# Patient Record
Sex: Male | Born: 1965 | Race: Black or African American | Hispanic: No | Smoking: Current every day smoker
Health system: Southern US, Community
[De-identification: ages and names within clinical notes are randomized; demographics above are authoritative.]

## PROBLEM LIST (undated history)

## (undated) DIAGNOSIS — I10 Essential (primary) hypertension: Secondary | ICD-10-CM

## (undated) DIAGNOSIS — G4730 Sleep apnea, unspecified: Secondary | ICD-10-CM

## (undated) DIAGNOSIS — F329 Major depressive disorder, single episode, unspecified: Secondary | ICD-10-CM

## (undated) DIAGNOSIS — F419 Anxiety disorder, unspecified: Secondary | ICD-10-CM

## (undated) DIAGNOSIS — F32A Depression, unspecified: Secondary | ICD-10-CM

## (undated) HISTORY — DX: Depression, unspecified: F32.A

## (undated) HISTORY — DX: Essential (primary) hypertension: I10

## (undated) HISTORY — DX: Major depressive disorder, single episode, unspecified: F32.9

## (undated) HISTORY — PX: FINGER SURGERY: SHX640

## (undated) HISTORY — DX: Sleep apnea, unspecified: G47.30

## (undated) HISTORY — DX: Anxiety disorder, unspecified: F41.9

---

## 1989-05-14 HISTORY — PX: VASECTOMY: SHX75

## 2008-05-14 HISTORY — PX: INCISION AND DRAINAGE ABSCESS / HEMATOMA OF BURSA / KNEE / THIGH: SUR668

## 2014-11-18 ENCOUNTER — Encounter: Payer: Self-pay | Admitting: Urology

## 2014-11-18 ENCOUNTER — Ambulatory Visit (INDEPENDENT_AMBULATORY_CARE_PROVIDER_SITE_OTHER): Payer: Self-pay | Admitting: Urology

## 2014-11-18 VITALS — BP 153/106 | HR 66 | Ht 66.0 in | Wt 185.3 lb

## 2014-11-18 DIAGNOSIS — N529 Male erectile dysfunction, unspecified: Secondary | ICD-10-CM

## 2014-11-18 DIAGNOSIS — N471 Phimosis: Secondary | ICD-10-CM

## 2014-11-18 MED ORDER — SILDENAFIL CITRATE 50 MG PO TABS: 50.0000 mg | ORAL_TABLET | Freq: Every day | ORAL | Status: DC | PRN

## 2014-11-18 NOTE — Progress Notes (Signed)
11/18/2014 11:17 AM   Joshua Banks [birth year] [id number]  Referring provider: No referring provider defined for this encounter.  Chief Complaint  Patient presents with  . Phimosis    New patient- Circumcision Consult    HPI: 49 year old man who presents today for evaluation of possible circumcision. He reports that over the past 10 years, he's had issues at times retracting his foreskin and infections which are quite bothersome.  He was never circumcised as a child but regrets this. He is very interested in proceeding with circumcision for both the purpose of cosmesis as well as to help prevent recurrent episodes of phimosis.  He any difficulty urinating or any urinary symptoms.  He does report a history of mild erectile dysfunction with difficulty maintaining his erections over the past few years.  He tried Viagra from friend about 10 years ago which he tolerated but didn't  Really need the medication at the time.  No problems with ejaculation or orgasm.   No contraindications for PDE 5 inhibitors.    PMH: Past Medical History  Diagnosis Date  . Anxiety   . Depression   . Hypertension   . Sleep apnea     Surgical History: Past Surgical History  Procedure Laterality Date  . Vasectomy  1991  . Finger surgery  1990s  . Incision and drainage abscess / hematoma of bursa / knee / thigh  2010    Home Medications:    Medication List       This list is accurate as of: 11/18/14 11:59 PM.  Always use your most recent med list.               sildenafil 50 MG tablet  Commonly known as:  VIAGRA  Take 1 tablet (50 mg total) by mouth daily as needed for erectile dysfunction.        Allergies:  Allergies  Allergen Reactions  . Bactrim [Sulfamethoxazole-Trimethoprim]     Yeast infection    Family History: Family History  Problem Relation Age of Onset  . Stroke Father   . Heart disease Sister     Social History:  reports that he has been smoking Cigarettes.  He  has a 17.5 pack-year smoking history. He does not have any smokeless tobacco history on file. He reports that he drinks alcohol. He reports that he does not use illicit drugs.  ROS: Urological Symptom Review  Patient is experiencing the following symptoms: Painful intercourse Weak stream Erection problems (male only) Penile pain (male only)    Review of Systems  Gastrointestinal (upper)  : Negative for upper GI symptoms  Gastrointestinal (lower) : Negative for lower GI symptoms  Constitutional : Night Sweats Fatigue  Skin: Negative for skin symptoms  Eyes: Negative for eye symptoms  Ear/Nose/Throat : Negative for Ear/Nose/Throat symptoms  Hematologic/Lymphatic: Negative for Hematologic/Lymphatic symptoms  Cardiovascular : Negative for cardiovascular symptoms  Respiratory : Negative for respiratory symptoms  Endocrine: Negative for endocrine symptoms  Musculoskeletal: Back pain Joint pain  Neurological: Headaches  Psychologic: Depression  Physical Exam: BP 153/106 mmHg  Pulse 66  Ht 5\' 6"  (1.676 m)  Wt 185 lb 4.8 oz (84.052 kg)  BMI 29.92 kg/m2  Constitutional:  Alert and oriented, No acute distress. HEENT: [place] AT, moist mucus membranes.  Trachea midline, no masses. Cardiovascular: No clubbing, cyanosis, or edema. Respiratory: Normal respiratory effort, no increased work of breathing. GI: Abdomen is soft, nontender, nondistended, no abdominal masses GU: No CVA tenderness. Uncircumcised phallus with redundant foreskin which  does retract easily.  Normal scrotum with bilateral descended testicles.   Skin: No rashes, bruises or suspicious lesions. Lymph: No cervical or inguinal adenopathy. Neurologic: Grossly intact, no focal deficits, moving all 4 extremities. Psychiatric: Normal mood and affect.   Assessment & Plan:   1. Phimosis Recurrent episodes of phimosis desiring circumcision.  We discussed risks and benefits of the procedure including  risk of bleeding, infection, damage to surrounding structures, penile sensitivity, and dissatisfaction with cosmesis.  We reviewed that the procedure could be done either in the office under local anesthesia or in the operating room under general anesthesia. He would proceed with circumcision in the office.  2. Erectile dysfunction, unspecified erectile dysfunction type Difficulty maintaining erection.  Trial of PDEi discussed. - sildenafil (VIAGRA) 50 MG tablet; Take 1 tablet (50 mg total) by mouth daily as needed for erectile dysfunction.  Dispense: 6 tablet; Refill: 0   Return in about 4 weeks (around 12/16/2014) for circumcision.  Vanna Scotland, MD  [facility] [address], [address] [place], [place] [address] [phone]

## 2014-11-18 NOTE — Patient Instructions (Signed)
   Circumcision Information and Post Care Instructions  Preparation: Pubic Hair There is no need to completely shave your pubic hair but it is desirable to trim it fairly short. Trim your pubic hair a few days in advance of the operation to allow time for the cut ends to soften again. Hygiene On the morning of the circumcision ensure that you take a good bath or shower and pay particular attention to your genitals. Retract your foreskin as far as you can and clean well under it. Immediately before the time of the procedure empty your bowels and bladder.   After-care: After the procedure your whole penis will be swollen and look very bruised. This is a normal effect of both the injected anaesthetic and the handling it necessarily receives during the operation. These will gradually reduce over the next week or two. Underwear If you normally wear boxers you may find that they give insufficient support immediately post procedure. You may wish to consider some form of briefs which will hold your penis in position to provide support and reduce the friction The Bandage The bandage will normally be wound tightly around the penis. Leave for 48hrs and keep clean and dry.    Promoting Healing Do not apply any antiseptic cream to your penis, nor add any antiseptic to bath water. Though they do help to kill germs, most are corrosive to new skin and actually slow down healing. In the rare cases where an infection develops, see a doctor as soon as possible. Smoking can delay healing and place you at higher risk of infection. You should quit or significantly reduce the amount of cigarettes you smoke prior to and after procedure.  Pain Killers Everyone reacts differently in respect of pain. For most people circumcision will not be truly painful, but a degree of discomfort is to be expected during the first few days. If you choose to take pain killing tablets like Tylenol then follow the instructions precisely.  Do not take more than the recommended maximum dose. Do not take Aspirin or any Aspirin based product since these thin the blood and have an anti-clotting action which can increase bleeding from a wound.  The Stitches Stitches need to remain in place long enough for the cut edges to knit together but not so long as to allow the skin around them to fully heal. In practice this usually means they should remain for between 1 and 2 weeks. Although the doctor will normally use soluble (or self-dissolving) stitches and will dissolve/ fall out on their own.    Time off School or Work There is no absolute need to take time off school or work after circumcision, but you may find it very hard to concentrate on work for the first few days and so may find it useful to take a week off. A week (or even two) off work is very desirable if you do heavy lifting or if your job keeps you seated and unable to move around freely for long periods. You should naturally avoid energetic or contact sports, sexual activity, cycling and swimming until your circumcision has fully healed.      

## 2014-12-24 ENCOUNTER — Ambulatory Visit (INDEPENDENT_AMBULATORY_CARE_PROVIDER_SITE_OTHER): Payer: Managed Care, Other (non HMO) | Admitting: Urology

## 2014-12-24 ENCOUNTER — Encounter: Payer: Self-pay | Admitting: Urology

## 2014-12-24 VITALS — BP 193/101 | HR 48 | Ht 66.0 in | Wt 187.8 lb

## 2014-12-24 DIAGNOSIS — N471 Phimosis: Secondary | ICD-10-CM

## 2014-12-24 DIAGNOSIS — N529 Male erectile dysfunction, unspecified: Secondary | ICD-10-CM | POA: Diagnosis not present

## 2014-12-24 NOTE — Patient Instructions (Signed)
   Circumcision Information and Post Care Instructions  Preparation: Pubic Hair There is no need to completely shave your pubic hair but it is desirable to trim it fairly short. Trim your pubic hair a few days in advance of the operation to allow time for the cut ends to soften again. Hygiene On the morning of the circumcision ensure that you take a good bath or shower and pay particular attention to your genitals. Retract your foreskin as far as you can and clean well under it. Immediately before the time of the procedure empty your bowels and bladder.   After-care: After the procedure your whole penis will be swollen and look very bruised. This is a normal effect of both the injected anaesthetic and the handling it necessarily receives during the operation. These will gradually reduce over the next week or two. Underwear If you normally wear boxers you may find that they give insufficient support immediately post procedure. You may wish to consider some form of briefs which will hold your penis in position to provide support and reduce the friction The Bandage The bandage will normally be wound tightly around the penis. Leave for 48hrs and keep clean and dry.    Promoting Healing Do not apply any antiseptic cream to your penis, nor add any antiseptic to bath water. Though they do help to kill germs, most are corrosive to new skin and actually slow down healing. In the rare cases where an infection develops, see a doctor as soon as possible. Smoking can delay healing and place you at higher risk of infection. You should quit or significantly reduce the amount of cigarettes you smoke prior to and after procedure.  Pain Killers Everyone reacts differently in respect of pain. For most people circumcision will not be truly painful, but a degree of discomfort is to be expected during the first few days. If you choose to take pain killing tablets like Tylenol then follow the instructions precisely.  Do not take more than the recommended maximum dose. Do not take Aspirin or any Aspirin based product since these thin the blood and have an anti-clotting action which can increase bleeding from a wound.  The Stitches Stitches need to remain in place long enough for the cut edges to knit together but not so long as to allow the skin around them to fully heal. In practice this usually means they should remain for between 1 and 2 weeks. Although the doctor will normally use soluble (or self-dissolving) stitches and will dissolve/ fall out on their own.    Time off School or Work There is no absolute need to take time off school or work after circumcision, but you may find it very hard to concentrate on work for the first few days and so may find it useful to take a week off. A week (or even two) off work is very desirable if you do heavy lifting or if your job keeps you seated and unable to move around freely for long periods. You should naturally avoid energetic or contact sports, sexual activity, cycling and swimming until your circumcision has fully healed.      

## 2014-12-24 NOTE — Progress Notes (Signed)
1:59 PM  12/24/14  Joshua Banks 09-Nov-1965 [id number]  Referring provider: No referring provider defined for this encounter.  Chief Complaint  Patient presents with  . Circumcision    HPI: 49 year old man who presents today for circumcision.  He has a history of redundant foreskin and a history of recurrent phimosis.    Risks and benefits previously discussed at length with the patient.  All of his additional questions were answered prior to the procedure today.   PMH: Past Medical History  Diagnosis Date  . Anxiety   . Depression   . Hypertension   . Sleep apnea     Surgical History: Past Surgical History  Procedure Laterality Date  . Vasectomy  1991  . Finger surgery  1990s  . Incision and drainage abscess / hematoma of bursa / knee / thigh  2010    Home Medications:    Medication List       This list is accurate as of: 12/24/14  1:59 PM.  Always use your most recent med list.               sildenafil 50 MG tablet  Commonly known as:  VIAGRA  Take 1 tablet (50 mg total) by mouth daily as needed for erectile dysfunction.        Allergies:  Allergies  Allergen Reactions  . Bactrim [Sulfamethoxazole-Trimethoprim]     Yeast infection    Family History: Family History  Problem Relation Age of Onset  . Stroke Father   . Heart disease Sister   . Kidney disease Neg Hx   . Prostate cancer Neg Hx   . Breast cancer Sister     Social History:  reports that he has been smoking Cigarettes.  He has a 17.5 pack-year smoking history. He does not have any smokeless tobacco history on file. He reports that he drinks alcohol. He reports that he does not use illicit drugs.   Physical Exam: BP 193/101 mmHg  Pulse 48  Ht 5\' 6"  (1.676 m)  Wt 187 lb 12.8 oz (85.186 kg)  BMI 30.33 kg/m2  Constitutional:  Alert and oriented, No acute distress. HEENT: [place] AT, moist mucus membranes.  Trachea midline, no masses. Cardiovascular: No clubbing, cyanosis, or  edema. Respiratory: Normal respiratory effort, no increased work of breathing. GI: Abdomen is soft, nontender, nondistended, no abdominal masses GU: No CVA tenderness. Uncircumcised phallus with redundant foreskin which does retract easily.  Normal scrotum with bilateral descended testicles.   Skin: No rashes, bruises or suspicious lesions. Neurologic: Grossly intact, no focal deficits, moving all 4 extremities. Psychiatric: Normal mood and affect.  Circumcision Procedure   Informed consistent obtained.  Risks discussed.  He was then prepped and draped in a sterile fashion.    Pre-Procedure: -A dorsal penile and ring block were administered using 35 cc 1% lidocaine.  The surgical site was then tested and adequate anesthesia was achieved.    Procedure: - Forceps were used to stretch the phimotic foreskin and expose the coronal area exposing the glans and meatus. -Two circumferential incision were made on the penile skin, one in the mid shaft and another approximately 1 cm proximal to the coronal margin using a blade. -The foreskin was then removed in a sleeve-like fashion with care taken to avoid injury to the urethra.  - Bleeding points were cauterized and adequate hemostasis was achieved - Skin margins were reapproximated with interrupted 3-0 chromic catgut sutures. -A dressing of Xeroform and gauze was applied.  Post-Procedure: -RTC in 4 weeks for wound check   Assessment & Plan:   1. Phimosis S/p uncomplicated circ today Post procedure instructions given Plan for wound check in 4 weeks  2. Erectile dysfunction, unspecified erectile dysfunction type Difficulty maintaining erection.  Trial of Viagram  Return in about 4 weeks (around 01/21/2015) for wound check.  Vanna Scotland, MD  [facility] [address] [place], [place] [address] [phone]

## 2015-05-27 ENCOUNTER — Encounter: Payer: Self-pay | Admitting: Obstetrics and Gynecology

## 2015-05-27 ENCOUNTER — Ambulatory Visit (INDEPENDENT_AMBULATORY_CARE_PROVIDER_SITE_OTHER): Payer: Managed Care, Other (non HMO) | Admitting: Obstetrics and Gynecology

## 2015-05-27 VITALS — BP 182/96 | HR 61 | Resp 16 | Ht 66.0 in | Wt 196.5 lb

## 2015-05-27 DIAGNOSIS — R310 Gross hematuria: Secondary | ICD-10-CM

## 2015-05-27 DIAGNOSIS — R109 Unspecified abdominal pain: Secondary | ICD-10-CM

## 2015-05-27 LAB — MICROSCOPIC EXAMINATION
Bacteria, UA: NONE SEEN
EPITHELIAL CELLS (NON RENAL): NONE SEEN /HPF (ref 0–10)
RBC, UA: >30 /hpf — ABNORMAL HIGH (ref 0–?)
WBC, UA: NONE SEEN /hpf (ref 0–?)

## 2015-05-27 LAB — URINALYSIS, COMPLETE
BILIRUBIN UA: NEGATIVE
Glucose, UA: NEGATIVE
Ketones, UA: NEGATIVE
Leukocytes, UA: NEGATIVE
Nitrite, UA: NEGATIVE
Protein, UA: NEGATIVE
Specific Gravity, UA: 1.02 (ref 1.005–1.030)
UUROB: 1 mg/dL (ref 0.2–1.0)
pH, UA: 7 (ref 5.0–7.5)

## 2015-05-27 MED ORDER — SILODOSIN 8 MG PO CAPS: 8.0000 mg | ORAL_CAPSULE | Freq: Every day | ORAL | Status: AC

## 2015-05-27 NOTE — Progress Notes (Signed)
05/27/2015 10:15 AM   Joshua Banks [birth year] [id number]  Referring provider: No referring provider defined for this encounter.  Chief Complaint  Patient presents with  . Hematuria    HPI: Patient is a 50 year old African-American male who presents today with complaints of gross hematuria beginning this morning. He reports that he has experienced some right-sided flank pain over the last week. He denies fevers, nausea or vomiting. No other urinary symptoms.  No history of stones.  Remote history of epididimytis.  Current smoker 1/2ppd x 40years.  No new sexual partners.  Last STI screening 1 year ago.    He does not get routine prostate cancer screening.  Drinks 3-4 bottles of water per day.   PMH: Past Medical History  Diagnosis Date  . Anxiety   . Depression   . Hypertension   . Sleep apnea     Surgical History: Past Surgical History  Procedure Laterality Date  . Vasectomy  1991  . Finger surgery  1990s  . Incision and drainage abscess / hematoma of bursa / knee / thigh  2010    Home Medications:    Medication List       This list is accurate as of: 05/27/15 10:15 AM.  Always use your most recent med list.               sildenafil 50 MG tablet  Commonly known as:  VIAGRA  Take 1 tablet (50 mg total) by mouth daily as needed for erectile dysfunction.     silodosin 8 MG Caps capsule  Commonly known as:  RAPAFLO  Take 1 capsule (8 mg total) by mouth daily with breakfast.        Allergies:  Allergies  Allergen Reactions  . Bactrim [Sulfamethoxazole-Trimethoprim]     Yeast infection    Family History: Family History  Problem Relation Age of Onset  . Stroke Father   . Heart disease Sister   . Kidney disease Neg Hx   . Prostate cancer Neg Hx   . Breast cancer Sister     Social History:  reports that he has been smoking Cigarettes.  He has a 17.5 pack-year smoking history. He does not have any smokeless tobacco history on file. He reports  that he drinks alcohol. He reports that he does not use illicit drugs.  ROS: UROLOGY Frequent Urination?: Yes Hard to postpone urination?: Yes Burning/pain with urination?: Yes Get up at night to urinate?: No Leakage of urine?: No Urine stream starts and stops?: No Trouble starting stream?: No Do you have to strain to urinate?: No Blood in urine?: Yes Urinary tract infection?: No Sexually transmitted disease?: No Injury to kidneys or bladder?: No Painful intercourse?: No Weak stream?: Yes Erection problems?: Yes Penile pain?: Yes  Gastrointestinal Nausea?: No Vomiting?: No Indigestion/heartburn?: No Diarrhea?: No Constipation?: No  Constitutional Fever: No Night sweats?: Yes Weight loss?: No Fatigue?: No  Skin Skin rash/lesions?: No Itching?: No  Eyes Blurred vision?: No Double vision?: No  Ears/Nose/Throat Sore throat?: No Sinus problems?: No  Hematologic/Lymphatic Swollen glands?: No Easy bruising?: No  Cardiovascular Leg swelling?: No Chest pain?: Yes  Respiratory Cough?: No Shortness of breath?: Yes  Endocrine Excessive thirst?: No  Musculoskeletal Back pain?: Yes Joint pain?: No  Neurological Headaches?: Yes Dizziness?: No  Psychologic Depression?: No Anxiety?: No  Physical Exam: BP 182/96 mmHg  Pulse 61  Resp 16  Ht 5\' 6"  (1.676 m)  Wt 196 lb 8 oz (89.132 kg)  BMI 31.73 kg/m2  Constitutional:  Alert and oriented, No acute distress. HEENT: [place] AT, moist mucus membranes.  Trachea midline, no masses. Cardiovascular: No clubbing, cyanosis, or edema. Respiratory: Normal respiratory effort, no increased work of breathing. GI: Abdomen is soft, nontender, nondistended, no abdominal masses GU: mild right CVA tenderness. palpable clamp from previous vasectomy on left side, testicles descended bilaterally, nontender, normal circumcised phallus DRE: smooth, nontender, slightly enlarged Skin: No rashes, bruises or suspicious  lesions. Lymph: No cervical or inguinal adenopathy. Neurologic: Grossly intact, no focal deficits, moving all 4 extremities. Psychiatric: Normal mood and affect.  Laboratory Data:   Urinalysis  Pertinent Imaging:   Assessment & Plan:    1. Gross hematuria- We discussed the differential diagnosis for hematuria including nephrolithiasis, renal or upper tract tumors, bladder stones, UTIs, or bladder tumors as well as undetermined etiologies. Per AUA guidelines, I did recommend complete hematuria evaluation including CTU, possible urine cytology, and office cystoscopy. - Urinalysis, Complete  2. Flank pain-  Possible kidney stones. Samples of Rapaflo and urine strainer provided.  Encourage patient to push fluids.  3. Prostate cancer screening- DRE slightly enlarged.  Will obtain PSA once hematuria work up complete.   Return for CT Urogram results and cystoscopy.  These notes generated with voice recognition software. I apologize for typographical errors.  Earlie Lou, FNP  [facility] [address] [place], [place] [address] [phone]

## 2015-05-27 NOTE — Patient Instructions (Signed)
Cystoscopy Cystoscopy is a procedure that is used to help your caregiver diagnose and sometimes treat conditions that affect your lower urinary tract. Your lower urinary tract includes your bladder and the tube through which urine passes from your bladder out of your body (urethra). Cystoscopy is performed with a thin, tube-shaped instrument (cystoscope). The cystoscope has lenses and a light at the end so that your caregiver can see inside your bladder. The cystoscope is inserted at the entrance of your urethra. Your caregiver guides it through your urethra and into your bladder. There are two main types of cystoscopy:  Flexible cystoscopy (with a flexible cystoscope).  Rigid cystoscopy (with a rigid cystoscope). Cystoscopy may be recommended for many conditions, including:  Urinary tract infections.  Blood in your urine (hematuria).  Loss of bladder control (urinary incontinence) or overactive bladder.  Unusual cells found in a urine sample.  Urinary blockage.  Painful urination. Cystoscopy may also be done to remove a sample of your tissue to be checked under a microscope (biopsy). It may also be done to remove or destroy bladder stones. LET YOUR CAREGIVER KNOW ABOUT:  Allergies to food or medicine.  Medicines taken, including vitamins, herbs, eyedrops, over-the-counter medicines, and creams.  Use of steroids (by mouth or creams).  Previous problems with anesthetics or numbing medicines.  History of bleeding problems or blood clots.  Previous surgery.  Other health problems, including diabetes and kidney problems.  Possibility of pregnancy, if this applies. PROCEDURE The area around the opening to your urethra will be cleaned. A medicine to numb your urethra (local anesthetic) is used. If a tissue sample or stone is removed during the procedure, you may be given a medicine to make you sleep (general anesthetic). Your caregiver will gently insert the tip of the cystoscope  into your urethra. The cystoscope will be slowly glided through your urethra and into your bladder. Sterile fluid will flow through the cystoscope and into your bladder. The fluid will expand and stretch your bladder. This gives your caregiver a better view of your bladder walls. The procedure lasts about 15-20 minutes. AFTER THE PROCEDURE If a local anesthetic is used, you will be allowed to go home as soon as you are ready. If a general anesthetic is used, you will be taken to a recovery area until you are stable. You may have temporary bleeding and burning on urination.   This information is not intended to replace advice given to you by your health care provider. Make sure you discuss any questions you have with your health care provider.   Document Released: 04/27/2000 Document Revised: 05/21/2014 Document Reviewed: 10/22/2011 Elsevier Interactive Patient Education 2016 [employer] Hematuria, Adult Hematuria is blood in your urine. It can be caused by a bladder infection, kidney infection, prostate infection, kidney stone, or cancer of your urinary tract. Infections can usually be treated with medicine, and a kidney stone usually will pass through your urine. If neither of these is the cause of your hematuria, further workup to find out the reason may be needed. It is very important that you tell your health care provider about any blood you see in your urine, even if the blood stops without treatment or happens without causing pain. Blood in your urine that happens and then stops and then happens again can be a symptom of a very serious condition. Also, pain is not a symptom in the initial stages of many urinary cancers. HOME CARE INSTRUCTIONS   Drink lots of fluid, 3-4   quarts a day. If you have been diagnosed with an infection, cranberry juice is especially recommended, in addition to large amounts of water.  Avoid caffeine, tea, and carbonated beverages because they tend to irritate the  bladder.  Avoid alcohol because it may irritate the prostate.  Take all medicines as directed by your health care provider.  If you were prescribed an antibiotic medicine, finish it all even if you start to feel better.  If you have been diagnosed with a kidney stone, follow your health care provider's instructions regarding straining your urine to catch the stone.  Empty your bladder often. Avoid holding urine for long periods of time.  After a bowel movement, women should cleanse front to back. Use each tissue only once.  Empty your bladder before and after sexual intercourse if you are a male. SEEK MEDICAL CARE IF:  You develop back pain.  You have a fever.  You have a feeling of sickness in your stomach (nausea) or vomiting.  Your symptoms are not better in 3 days. Return sooner if you are getting worse. SEEK IMMEDIATE MEDICAL CARE IF:   You develop severe vomiting and are unable to keep the medicine down.  You develop severe back or abdominal pain despite taking your medicines.  You begin passing a large amount of blood or clots in your urine.  You feel extremely weak or faint, or you pass out. MAKE SURE YOU:   Understand these instructions.  Will watch your condition.  Will get help right away if you are not doing well or get worse.   This information is not intended to replace advice given to you by your health care provider. Make sure you discuss any questions you have with your health care provider.   Document Released: 04/30/2005 Document Revised: 05/21/2014 Document Reviewed: 12/29/2012 Elsevier Interactive Patient Education 2016 Elsevier Inc.  

## 2015-06-06 ENCOUNTER — Ambulatory Visit
Admission: RE | Admit: 2015-06-06 | Discharge: 2015-06-06 | Disposition: A | Discharge status: Home / Self Care | Payer: Managed Care, Other (non HMO) | Source: Ambulatory Visit | Attending: Obstetrics and Gynecology | Admitting: Obstetrics and Gynecology

## 2015-06-06 DIAGNOSIS — R310 Gross hematuria: Secondary | ICD-10-CM | POA: Insufficient documentation

## 2015-06-06 DIAGNOSIS — I700 Atherosclerosis of aorta: Secondary | ICD-10-CM | POA: Insufficient documentation

## 2015-06-06 DIAGNOSIS — N401 Enlarged prostate with lower urinary tract symptoms: Secondary | ICD-10-CM | POA: Diagnosis not present

## 2015-06-06 MED ORDER — IOHEXOL 350 MG/ML SOLN
125.0000 mL | Freq: Once | INTRAVENOUS | Status: AC | PRN
  Administered 2015-06-06: 125 mL via INTRAVENOUS

## 2015-06-13 ENCOUNTER — Encounter: Payer: Self-pay | Admitting: Urology

## 2015-06-13 ENCOUNTER — Ambulatory Visit (INDEPENDENT_AMBULATORY_CARE_PROVIDER_SITE_OTHER): Payer: Managed Care, Other (non HMO) | Admitting: Urology

## 2015-06-13 VITALS — BP 172/114 | HR 70 | Ht 66.0 in | Wt 196.2 lb

## 2015-06-13 DIAGNOSIS — R310 Gross hematuria: Secondary | ICD-10-CM | POA: Diagnosis not present

## 2015-06-13 LAB — URINALYSIS, COMPLETE
Bilirubin, UA: NEGATIVE
Glucose, UA: NEGATIVE
Ketones, UA: NEGATIVE
Leukocytes, UA: NEGATIVE
NITRITE UA: NEGATIVE
Protein, UA: NEGATIVE
Specific Gravity, UA: 1.025 (ref 1.005–1.030)
Urobilinogen, Ur: 0.2 mg/dL (ref 0.2–1.0)
pH, UA: 6 (ref 5.0–7.5)

## 2015-06-13 LAB — MICROSCOPIC EXAMINATION
Bacteria, UA: NONE SEEN
Epithelial Cells (non renal): NONE SEEN /hpf (ref 0–10)

## 2015-06-13 MED ORDER — LIDOCAINE HCL 2 % EX GEL
1.0000 | Freq: Once | CUTANEOUS | Status: AC
Start: 2015-06-13 — End: 2015-06-13
  Administered 2015-06-13: 1 via URETHRAL

## 2015-06-13 MED ORDER — CIPROFLOXACIN HCL 500 MG PO TABS
500.0000 mg | ORAL_TABLET | Freq: Once | ORAL | Status: AC
  Administered 2015-06-13: 500 mg via ORAL

## 2015-06-13 NOTE — Procedures (Signed)
    Cystoscopy Procedure Note  Patient identification was confirmed, informed consent was obtained, and patient was prepped using Betadine solution.  Lidocaine jelly was administered per urethral meatus.    Preoperative abx where received prior to procedure.     Pre-Procedure: - Inspection reveals a normal caliber ureteral meatus.  Procedure: The flexible cystoscope was introduced without difficulty - No urethral strictures/lesions are present. - Enlarged prostate  - Normal bladder neck - Bilateral ureteral orifices identified - Bladder mucosa  reveals no ulcers, tumors, or lesions - No bladder stones - No trabeculation  Retroflexion shows normal bladder mucosa   Post-Procedure: - Patient tolerated the procedure well  Discussion:  The patient has undergone a thorough evaluation for his gross hematuria with no clear explanation or clinically significant etiology. The patient will follow up with urology on an as-needed basis.

## 2016-04-30 IMAGING — CT CT ABD-PEL WO/W CM
2 of 4 series · 14 of 32 positions shown, 19 images · IV contrast (APPLIED)
Comparison: None.

CLINICAL DATA: Gross hematuria for 1 week.

EXAM:
CT ABDOMEN AND PELVIS WITHOUT AND WITH CONTRAST
TECHNIQUE: Multidetector CT imaging of the abdomen and pelvis was performed
following the standard protocol before and following the bolus
administration of intravenous contrast.
CONTRAST:  125mL OMNIPAQUE IOHEXOL 350 MG/ML SOLN

[Series 2: axial pre · axial · non-contrast · 0.75mm/px · z∈[-1004,-754]mm · 6 of 92 slices shown]
[im 11/92  soft-tissue]
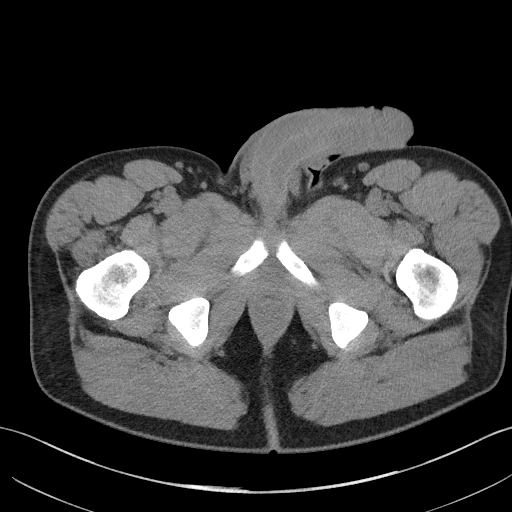
[im 21/92  soft-tissue]
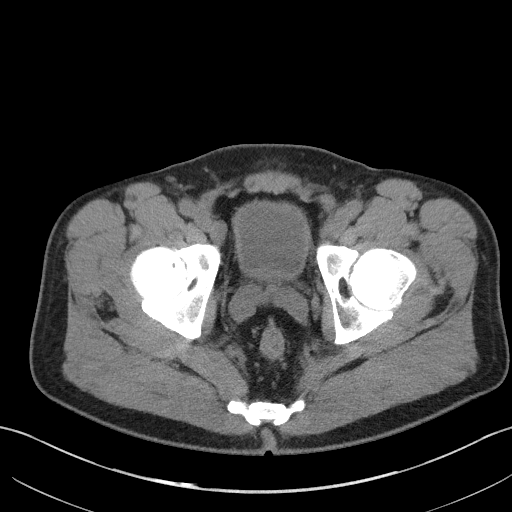
[im 31/92  soft-tissue]
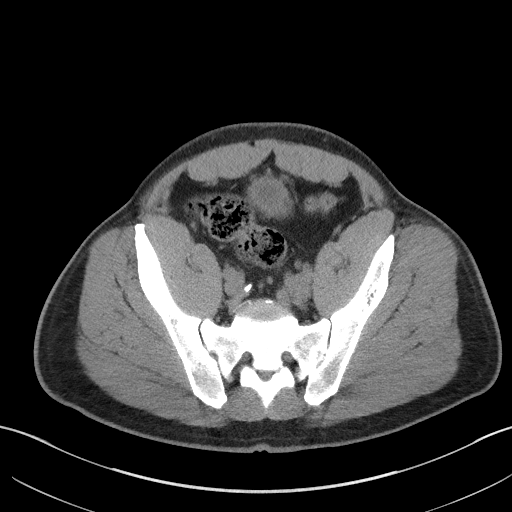
[im 41/92  soft-tissue]
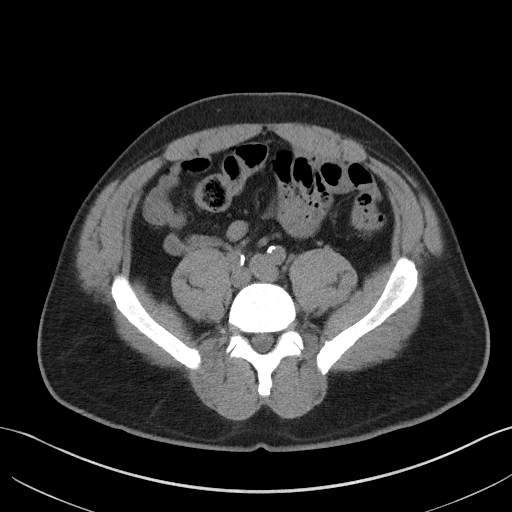
[im 51/92  soft-tissue]
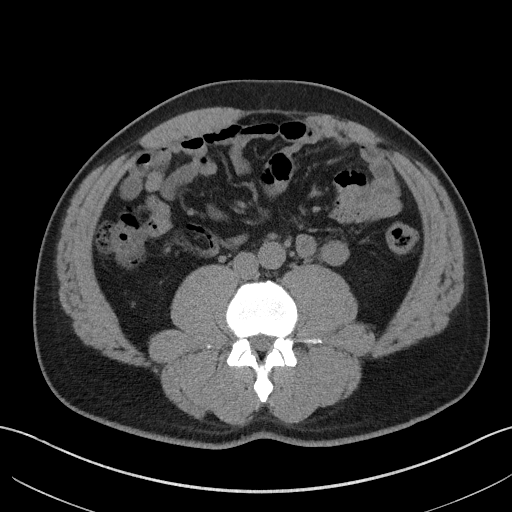
[im 61/92  soft-tissue]
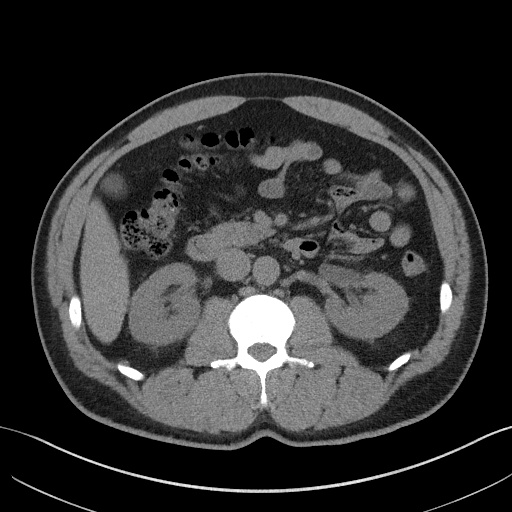

[Series 12: axial delay · axial · delayed · 0.75mm/px · z∈[-990,-630]mm · 8 of 94 slices shown, 13 images]
[im 11/94  soft-tissue]
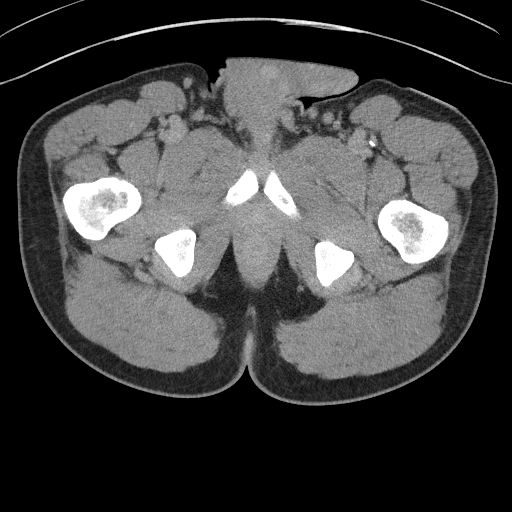
[im 11/94  bone]
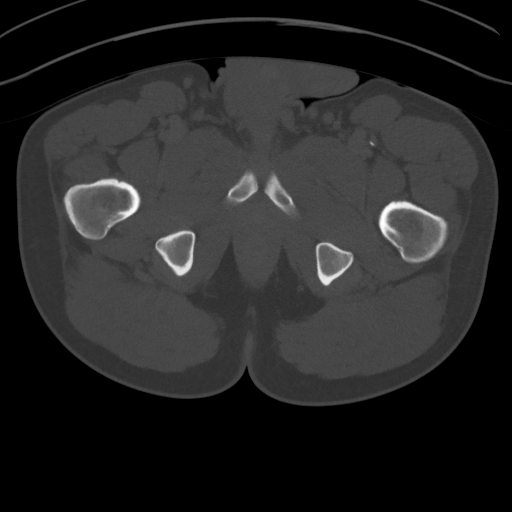
[im 21/94  soft-tissue]
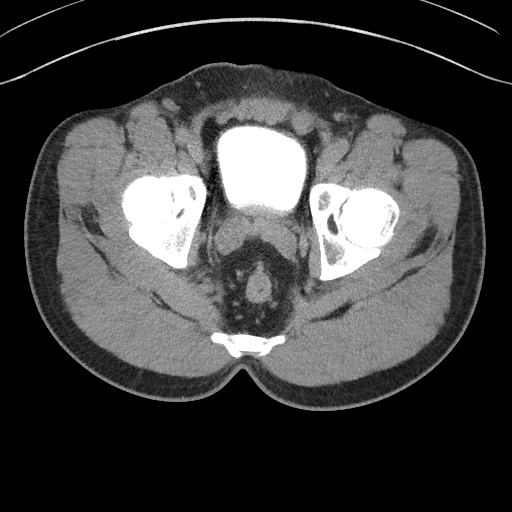
[im 32/94  soft-tissue]
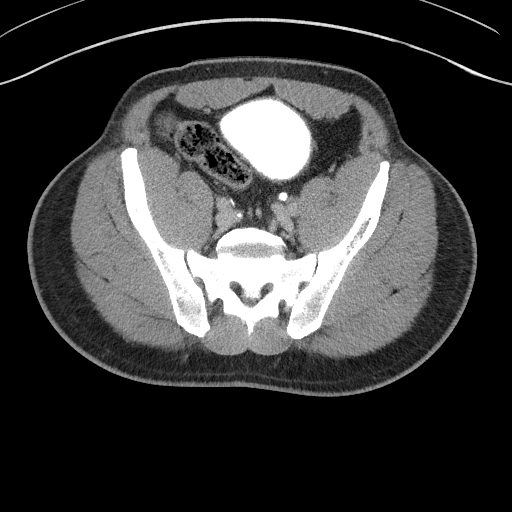
[im 42/94  soft-tissue]
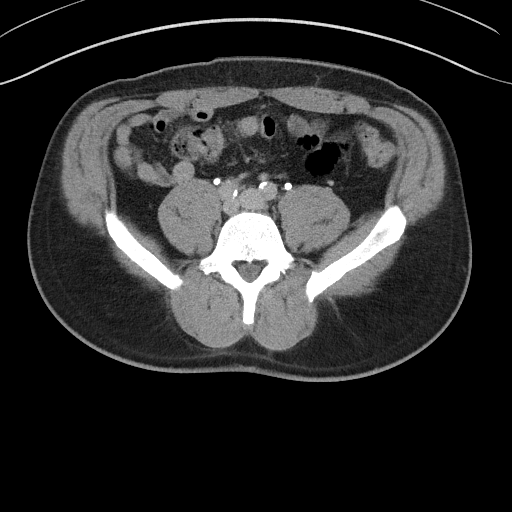
[im 52/94  soft-tissue]
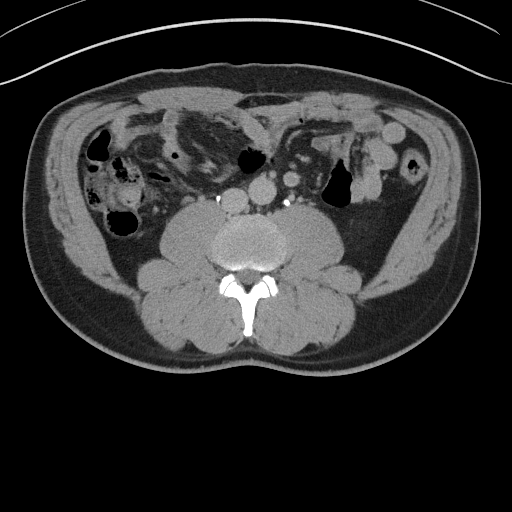
[im 52/94  lung]
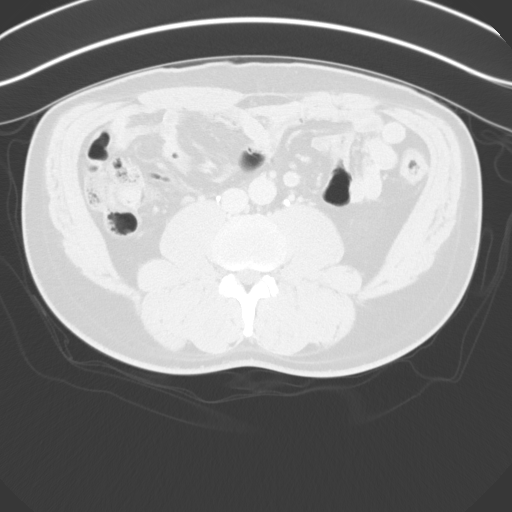
[im 63/94  soft-tissue]
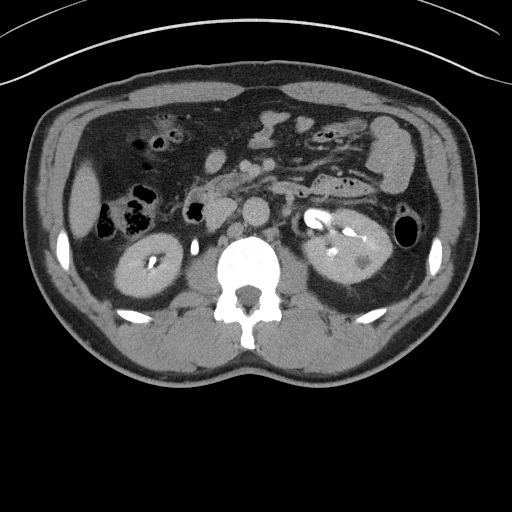
[im 63/94  lung]
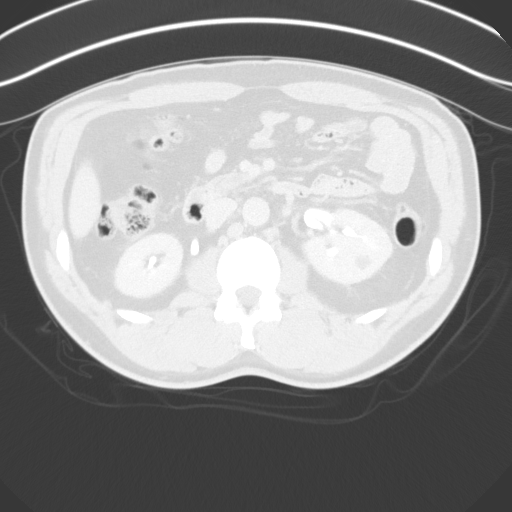
[im 73/94  soft-tissue]
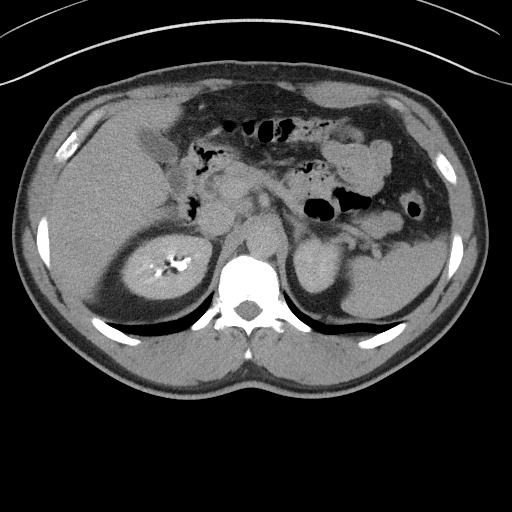
[im 73/94  lung]
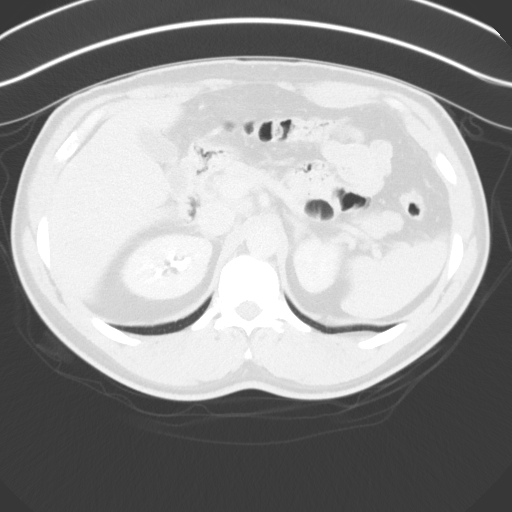
[im 83/94  soft-tissue]
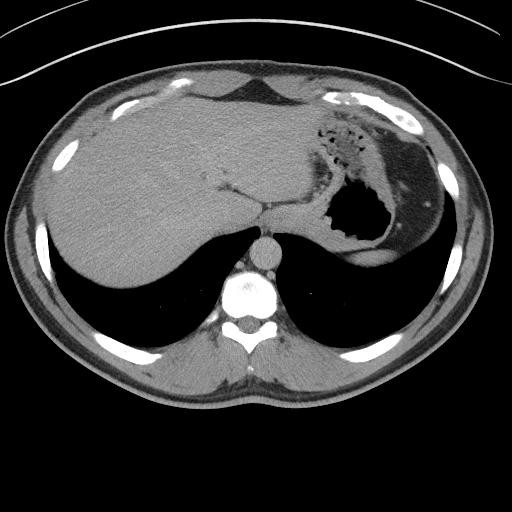
[im 83/94  lung]
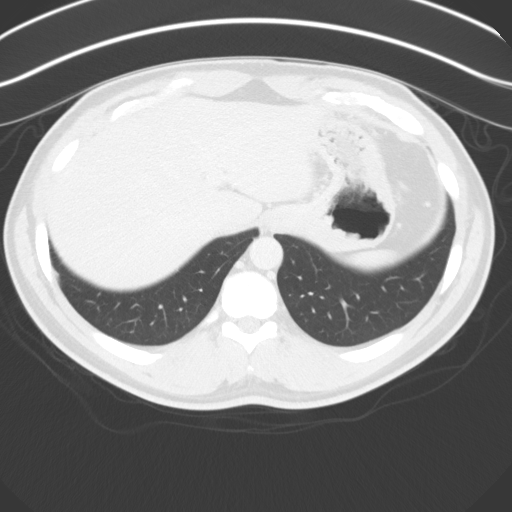

[14 of 32 positions shown; findings below may reference images not displayed]

FINDINGS: Lower chest: The lung bases appear clear. No pleural or pericardial
effusion identified.

Hepatobiliary: Mild hepatic steatosis. Normal appearance of the
gallbladder.

Pancreas: Negative

Spleen: The spleen appears normal.

Adrenals/Urinary Tract: Normal adrenal glands. No kidney stones
identified. No hydronephrosis or hydroureter. Several cysts are
identified within the left kidney. The largest measures 1.2 cm,
image 27 of series 4. Unremarkable appearance of the urinary
bladder.

Stomach/Bowel: The stomach is within normal limits. The small bowel
loops have a normal course and caliber. No obstruction. Normal
appearance of the colon.

Vascular/Lymphatic: Calcified atherosclerotic disease involves the
abdominal aorta. No aneurysm. No enlarged retroperitoneal or
mesenteric adenopathy. No enlarged pelvic or inguinal lymph nodes.

Reproductive: Prostate gland appears enlarged.

Other: There is no ascites or focal fluid collections within the
abdomen or pelvis. Small periumbilical hernia is noted which
contains fat only.

Musculoskeletal: There are degenerative changes involving the hip
joints, right greater than left. Degenerative disc disease is noted
within the lumbar spine.
IMPRESSION: 1. No acute findings identified within the abdomen or pelvis. There
is no explanation for patient's hematuria.
2. Prostate gland enlargement
3. Aortic atherosclerosis
